# Patient Record
Sex: Female | Born: 1995 | Race: Black or African American | Hispanic: No | Marital: Single | Smoking: Never smoker
Health system: Southern US, Community
[De-identification: ages and names within clinical notes are randomized; demographics above are authoritative.]

## PROBLEM LIST (undated history)

## (undated) DIAGNOSIS — I456 Pre-excitation syndrome: Secondary | ICD-10-CM

## (undated) DIAGNOSIS — D649 Anemia, unspecified: Secondary | ICD-10-CM

## (undated) HISTORY — PX: BREAST REDUCTION SURGERY: SHX8

## (undated) HISTORY — PX: ABLATION OF DYSRHYTHMIC FOCUS: SHX254

## (undated) HISTORY — PX: OTHER SURGICAL HISTORY: SHX169

---

## 2015-06-24 ENCOUNTER — Emergency Department (HOSPITAL_COMMUNITY)
Admission: EM | Admit: 2015-06-24 | Discharge: 2015-06-24 | Disposition: A | Discharge status: Home / Self Care | Payer: BLUE CROSS/BLUE SHIELD | Attending: Emergency Medicine | Admitting: Emergency Medicine

## 2015-06-24 ENCOUNTER — Encounter (HOSPITAL_COMMUNITY): Payer: Self-pay | Admitting: Emergency Medicine

## 2015-06-24 DIAGNOSIS — Z8679 Personal history of other diseases of the circulatory system: Secondary | ICD-10-CM | POA: Insufficient documentation

## 2015-06-24 DIAGNOSIS — J069 Acute upper respiratory infection, unspecified: Secondary | ICD-10-CM | POA: Insufficient documentation

## 2015-06-24 DIAGNOSIS — Z862 Personal history of diseases of the blood and blood-forming organs and certain disorders involving the immune mechanism: Secondary | ICD-10-CM | POA: Diagnosis not present

## 2015-06-24 DIAGNOSIS — R5383 Other fatigue: Secondary | ICD-10-CM | POA: Diagnosis present

## 2015-06-24 HISTORY — DX: Anemia, unspecified: D64.9

## 2015-06-24 HISTORY — DX: Pre-excitation syndrome: I45.6

## 2015-06-24 NOTE — ED Notes (Signed)
AVS explained in detail. Knows to follow up with PCP if needed. Knows to drink lots of fluids/rest/take Mucinex/use nasal spray if needed. No other c/c. No SOB noted.

## 2015-06-24 NOTE — ED Notes (Signed)
Pt states she has not felt good for the past 4 days  Pt states she has had decreased appetite, nausea, diarrhea for the past two days, sore throat, cough, chest soreness, sneezing  Pt states she has just been laying around and feels weak all over

## 2015-06-24 NOTE — ED Provider Notes (Signed)
CSN: [id number]   Arrival date & time 06/24/15 1847  History  This chart was scribed for non-physician practitioner, Earley Favor, NP working with Leta Baptist, MD by Bethel Born, ED Scribe. This patient was seen in room WTR8/WTR8 and the patient's care was started at 8:12 PM.  Chief Complaint  Patient presents with  . Fatigue  . Nausea  . Diarrhea    HPI The history is provided by the patient. No language interpreter was used.   Morgan Kennedy is a 19 y.o. female who presents to the Emergency Department complaining of fatigue with onset 4 days ago. Associated symptoms include sore throat, cough, sneezing, chest tightness, nausea, and diarrhea. She has been using vitamins and rest to treat her symptoms with insufficient relief. Pt denies fever and dysuria.   Past Medical History  Diagnosis Date  . WPW (Wolff-Parkinson-White syndrome)   . Anemia     Past Surgical History  Procedure Laterality Date  . Ablation of dysrhythmic focus    . Extraction of wisdom teeth    . Breast reduction surgery      Family History  Problem Relation Age of Onset  . Diabetes Other     Social History  Substance Use Topics  . Smoking status: Never Smoker   . Smokeless tobacco: None  . Alcohol Use: Yes     Comment: rare     Review of Systems  Constitutional: Positive for fatigue. Negative for fever.  HENT:       Sneezing  Respiratory: Positive for cough and chest tightness.   Gastrointestinal: Positive for nausea and diarrhea.  Genitourinary: Negative for dysuria.    Home Medications   Prior to Admission medications   Not on File    Allergies  Review of patient's allergies indicates no known allergies.  Triage Vitals: BP 126/82 mmHg  Pulse 90  Temp(Src) 98.4 F (36.9 C) (Oral)  Resp 18  SpO2 100%  LMP 06/22/2015 (Exact Date)  Physical Exam  Constitutional: She is oriented to person, place, and time. She appears well-developed and well-nourished.  HENT:  Head:  Normocephalic.  Right Ear: External ear normal.  Left Ear: External ear normal.  Mouth/Throat: Oropharynx is clear and moist.  Eyes: Pupils are equal, round, and reactive to light.  Neck: Normal range of motion.  Cardiovascular: Normal rate and regular rhythm.   Pulmonary/Chest: Effort normal and breath sounds normal.  Musculoskeletal: Normal range of motion.  Lymphadenopathy:    She has no cervical adenopathy.  Neurological: She is alert and oriented to person, place, and time.  Skin: Skin is warm and dry. No rash noted.  Nursing note and vitals reviewed.   ED Course  Procedures  DIAGNOSTIC STUDIES: Oxygen Saturation is 100% on RA,  normal by my interpretation.    COORDINATION OF CARE: 8:14 PM Discussed treatment plan with pt at bedside and pt agreed to plan.  Labs Review- Labs Reviewed - No data to display  Imaging Review No results found.   Appears well and well-hydrated.  I recommend that she take Tylenol or ibuprofen for her discomfort drink plenty of fluids and get plenty of rest MDM   Final diagnoses:  URI (upper respiratory infection)    I personally performed the services described in this documentation, which was scribed in my presence. The recorded information has been reviewed and is accurate.     Earley Favor, NP 06/24/15 5784  Leta Baptist, MD 06/26/15 [phone]

## 2015-06-24 NOTE — Discharge Instructions (Signed)

## 2015-10-01 ENCOUNTER — Emergency Department (HOSPITAL_COMMUNITY)
Admission: EM | Admit: 2015-10-01 | Discharge: 2015-10-01 | Disposition: A | Discharge status: Home / Self Care | Payer: BLUE CROSS/BLUE SHIELD | Attending: Emergency Medicine | Admitting: Emergency Medicine

## 2015-10-01 ENCOUNTER — Emergency Department (HOSPITAL_COMMUNITY): Payer: BLUE CROSS/BLUE SHIELD

## 2015-10-01 ENCOUNTER — Encounter (HOSPITAL_COMMUNITY): Payer: Self-pay | Admitting: Emergency Medicine

## 2015-10-01 DIAGNOSIS — S0990XA Unspecified injury of head, initial encounter: Secondary | ICD-10-CM | POA: Diagnosis not present

## 2015-10-01 DIAGNOSIS — F419 Anxiety disorder, unspecified: Secondary | ICD-10-CM | POA: Insufficient documentation

## 2015-10-01 DIAGNOSIS — S29001A Unspecified injury of muscle and tendon of front wall of thorax, initial encounter: Secondary | ICD-10-CM | POA: Insufficient documentation

## 2015-10-01 DIAGNOSIS — Y92410 Unspecified street and highway as the place of occurrence of the external cause: Secondary | ICD-10-CM | POA: Insufficient documentation

## 2015-10-01 DIAGNOSIS — Y998 Other external cause status: Secondary | ICD-10-CM | POA: Diagnosis not present

## 2015-10-01 DIAGNOSIS — S3992XA Unspecified injury of lower back, initial encounter: Secondary | ICD-10-CM | POA: Insufficient documentation

## 2015-10-01 DIAGNOSIS — Z8679 Personal history of other diseases of the circulatory system: Secondary | ICD-10-CM | POA: Diagnosis not present

## 2015-10-01 DIAGNOSIS — Z7951 Long term (current) use of inhaled steroids: Secondary | ICD-10-CM | POA: Diagnosis not present

## 2015-10-01 DIAGNOSIS — Y9389 Activity, other specified: Secondary | ICD-10-CM | POA: Insufficient documentation

## 2015-10-01 DIAGNOSIS — Z862 Personal history of diseases of the blood and blood-forming organs and certain disorders involving the immune mechanism: Secondary | ICD-10-CM | POA: Insufficient documentation

## 2015-10-01 MED ORDER — IBUPROFEN 800 MG PO TABS
800.0000 mg | ORAL_TABLET | Freq: Once | ORAL | Status: AC
  Administered 2015-10-01: 800 mg via ORAL
  Filled 2015-10-01: qty 1

## 2015-10-01 MED ORDER — IBUPROFEN 800 MG PO TABS: 800.0000 mg | ORAL_TABLET | Freq: Three times a day (TID) | ORAL | Status: DC

## 2015-10-01 MED ORDER — CYCLOBENZAPRINE HCL 10 MG PO TABS: 10.0000 mg | ORAL_TABLET | Freq: Two times a day (BID) | ORAL | Status: DC | PRN

## 2015-10-01 NOTE — Discharge Instructions (Signed)
Take your medications as prescribed. You may also apply ice to affected areas for 15-20 minutes 3-4 times daily as needed for pain relief. Please follow up with a primary care provider from the Resource Guide provided below in 4-5 days. Please return to the Emergency Department if symptoms worsen or new onset of fever, numbness, tingling, groinhesia, loss of bowel or bladder, weakness.    Emergency Department Resource Guide 1) Find a Doctor and Pay Out of Pocket Although you won't have to find out who is covered by your insurance plan, it is a good idea to ask around and get recommendations. You will then need to call the office and see if the doctor you have chosen will accept you as a new patient and what types of options they offer for patients who are self-pay. Some doctors offer discounts or will set up payment plans for their patients who do not have insurance, but you will need to ask so you aren't surprised when you get to your appointment.  2) Contact Your Local Health Department Not all health departments have doctors that can see patients for sick visits, but many do, so it is worth a call to see if yours does. If you don't know where your local health department is, you can check in your phone book. The CDC also has a tool to help you locate your state's health department, and many state websites also have listings of all of their local health departments.  3) Find a Walk-in Clinic If your illness is not likely to be very severe or complicated, you may want to try a walk in clinic. These are popping up all over the country in pharmacies, drugstores, and shopping centers. They're usually staffed by nurse practitioners or physician assistants that have been trained to treat common illnesses and complaints. They're usually fairly quick and inexpensive. However, if you have serious medical issues or chronic medical problems, these are probably not your best option.  No Primary Care  Doctor: - Call Health Connect at  [phone] - they can help you locate a primary care doctor that  accepts your insurance, provides certain services, etc. - Physician Referral Service- [phone]  Chronic Pain Problems: Organization         Address  Phone   Notes  [facility]  [phone] Patients need to be referred by their primary care doctor.   Medication Assistance: Organization         Address  Phone   Notes  [facility] Medication [facility] [address] [place], [place] [address] [phone] --Must be a resident of [facility] -- Must have NO insurance coverage whatsoever (no Medicaid/ Medicare, etc.) -- The pt. MUST have a primary care doctor that directs their care regularly and follows them in the community   MedAssist  [phone]   [employer]  [phone]    Agencies that provide inexpensive medical care: Organization         Address  Phone   Notes  [facility]  [phone]   [facility]    [phone]   [facility] [address] [place], [place] [address] [phone]   [facility] [address] [place] [place] [phone]   Planned Parenthood    [phone]   [facility]    [phone]   Community Health and [facility]  [address]  Mardene Speak Phone:  [phone], Fax:  [phone] Hours of Operation:  9 am - 6 pm, M-F.  Also accepts Medicaid/Medicare and self-pay.  [facility] for [place], [address], [place] Phone: [phone], Fax: [phone]. Hours of Operation:  8:30 am - 5:30 pm, M-F.  Also accepts Medicaid and self-pay.  [facility] [place] [address], [place] Phone: [phone]   [place], [place], [place] [phone], Ext. [phone] Mondays & Thursdays: 7-9 AM.  First 15 patients are seen on a first  come, first serve basis.    [place] Providers:  Organization         Address  Phone   Notes  [facility] [address], [place] [phone] Also accepts self-pay patients.  [facility] [id number] [place], [place]  [phone]   [place], [address], [place] [phone]   [facility] [address] [place] [phone]   Lucianne Lei [address], [place]   [phone] Only accepts [place] Access [place] patients after they have their name applied to their card.   Self-Pay (no insurance) in [facility]:  Organization         Address  Phone   Notes  Sickle Cell Patients, [facility] [place] [phone]   [facility] Urgent Care [place] [phone]   [facility] [place]  [place], [place], [place] [phone]   [facility]/Dr. Osei-Bonsu  [address] [place] or [address], [place] [phone] Phone number for both [place] and [place] locations is the same.  [facility]gent [facility] and [facility] [address] [place] [phone]   [facility] [address], [place] or [address] [phone]   [facility] [address], [place] [phone], phone; [phone], fax Sees patients 1st and 3rd Saturday of every month.  Must not qualify for public or private insurance (i.e. Medicaid, Medicare, [place] Health Choice, Veterans' Benefits)  Household income should be no more than 200% of the poverty level The clinic cannot treat you if you are pregnant or think you are pregnant  Sexually transmitted diseases are not treated at the clinic.    Dental Care: Organization          Address  Phone  Notes  [facility] [place] [phone] Accepts children up to age 30 who are enrolled in [place] or [place]; pregnant women with a Medicaid card; and children who have applied for Medicaid or [place] Health Choice, but were declined, whose parents can pay a reduced fee at time of service.  [facility] Department of [facility]  [address], [place] [phone] Accepts children up to age 23 who are enrolled in [place] or [place]; pregnant women with a Medicaid card; and children who have applied for Medicaid or  Health Choice, but were declined, whose parents can pay a reduced fee at time of service.  [facility]  [place] [phone] Patients are seen  by appointment only. Walk-ins are not accepted. [place] will see patients 67 years of age and older. Monday - Tuesday (8am-5pm) Most Wednesdays (8:30-5pm) $30 per visit, cash only  [facility] Access [facility]  [address], [facility] [phone] Patients are seen by appointment only. Walk-ins are not accepted. [place] will see patients 89 years of age and older. One Wednesday Evening (Monthly: Volunteer Based).  $30 per visit, cash only  [place]  [phone] for adults; Children under age 54, call Graduate Pediatric Dentistry at [phone]. Children aged 71-14, please call [phone] to request a pediatric application.  Dental services are provided in all areas of dental care including fillings, crowns and bridges, complete and partial dentures, implants, gum treatment, root canals, and extractions. Preventive care is also provided. Treatment is provided to both adults and children. Patients are selected via a lottery and there is often a waiting list.   [facility] [address], [place]  [phone] [email or web address]   [facility] [address] [place], [place] [phone], Ext. [phone] Second and Fourth Thursday of each month, opens at 6:30 AM; Clinic ends at 9 AM.  Patients are seen on a first-come first-served basis, and a limited number are seen during each clinic.   [facility]  [address] Hillard D[place]er [place], [place] [phone]   Eligibility Requirements You must have lived in [place], [place], or [place] counties for at least the last three months.   You cannot be eligible for state or federal sponsored [employer], including [employer], [place], or [employer].   You generally cannot be eligible for healthcare insurance through your employer.    How to apply: Eligibility screenings are held every Tuesday and Wednesday afternoon from 1:00 pm until 4:00 pm. You do not need an appointment for the interview!  [facility] [address], [place], [place]   [place]  [place] Department  [place]  [phone]    Behavioral Health Resources in the Community: Intensive Outpatient Programs Organization         Address  Phone  Notes  Rodessa [place]. [address], [place], [place] [phone]   [facility] Outpatient [address] [place], [place]   ADS: Alcohol & Drug Svcs [address], [place], [place]   [place] [address]  [place], [place] or [phone]   Substance Abuse Resources Organization         Address  Phone  Notes  Alcohol and Drug Services  [phone]   [place]  [phone]   [facility] [place]   Chinita Pester  [phone]   Residential & Outpatient Substance Abuse Program  [phone]   Psychological  Services Organization         Address  Phone  Notes  [facility]  [place]  [phone]   [place] [address] [place] or [phone]    Mobile Crisis Teams Organization         Address  Phone  Notes  [facility], Mobile Crisis Care Unit  [phone]   [facility] Psychotherapeutic Services  [address] [place], [place]   [facility] [address], [address] [place]  [phone]    Self-Help/Support Groups Organization         Address  Phone             Notes  Mental Health Assoc. of [place] - variety of support groups  336- [phone] Call for more information  Narcotics Anonymous (NA), [facility] [address], [place]  2 meetings at this location   Statisticianesidential Treatment Programs Organization         Address  Phone  Notes  [facility] [address],    [place]  [phone]   [facility]  [address], [place] [phone], [place], [place] [phone]   [facility] [address], [place] [phone] Admissions: 8am-3pm M-F  [facility] [address]    [place], [place] [phone]   [facility] [address], [place], [place] [phone]   [facility] [address]  [place], [place] [phone]   [facility] - Intensive Outpatient [address], [place], [place] [phone]   [facility] ([facility] [address]  [place], [place] [phone] or [phone]   [facility] Services (RTS) [address] [place], [place] [phone] Accepts Medicaid  [facility] [address]  [place] [phone] Substance Abuse/Addiction Treatment   [facility] Behavioral Health Resources Organization         Address  Phone  Notes  [facility]  [phone](888)  [phone]   Angie FavaJulie Brannon, PhD [address] [place], [place]   [phone] or [phone]) [phone]   [facility]   [address] [place], [place] [phone](336) [phone]   [facility] [address], [place], [place] [phone]) [phone] Insurance/Medicaid/sponsorship through [facility]  [facility] Families [address]                                    [place], [place] [phone]) [phone] Therapy/tele-psych/case  [facility] [address]   [place], [place] [phone]) [phone]    Dr. Lolly MustacheArfeen  [phone]   [facility] [place]  [employer] [facility] 1) [address], [place] 2) [address], [place] 3)  [address], [place] [phone](336) [phone]  [phone](336) [phone]   [facility] Child Abuse Hotline [phone]) [phone] or [phone]) [phone] (After Hours)
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
## 2016-01-28 ENCOUNTER — Encounter (HOSPITAL_COMMUNITY): Payer: Self-pay

## 2016-01-28 ENCOUNTER — Emergency Department (HOSPITAL_COMMUNITY): Payer: BLUE CROSS/BLUE SHIELD

## 2016-01-28 ENCOUNTER — Emergency Department (HOSPITAL_COMMUNITY)
Admission: EM | Admit: 2016-01-28 | Discharge: 2016-01-28 | Disposition: A | Discharge status: Home / Self Care | Payer: BLUE CROSS/BLUE SHIELD | Attending: Emergency Medicine | Admitting: Emergency Medicine

## 2016-01-28 DIAGNOSIS — Y92414 Local residential or business street as the place of occurrence of the external cause: Secondary | ICD-10-CM | POA: Diagnosis not present

## 2016-01-28 DIAGNOSIS — M25512 Pain in left shoulder: Secondary | ICD-10-CM | POA: Insufficient documentation

## 2016-01-28 DIAGNOSIS — Y999 Unspecified external cause status: Secondary | ICD-10-CM | POA: Diagnosis not present

## 2016-01-28 DIAGNOSIS — M542 Cervicalgia: Secondary | ICD-10-CM | POA: Diagnosis not present

## 2016-01-28 DIAGNOSIS — Z6841 Body Mass Index (BMI) 40.0 and over, adult: Secondary | ICD-10-CM | POA: Insufficient documentation

## 2016-01-28 DIAGNOSIS — Z791 Long term (current) use of non-steroidal anti-inflammatories (NSAID): Secondary | ICD-10-CM | POA: Insufficient documentation

## 2016-01-28 DIAGNOSIS — Y939 Activity, unspecified: Secondary | ICD-10-CM | POA: Insufficient documentation

## 2016-01-28 DIAGNOSIS — M25511 Pain in right shoulder: Secondary | ICD-10-CM | POA: Diagnosis not present

## 2016-01-28 MED ORDER — IBUPROFEN 800 MG PO TABS: 800.0000 mg | ORAL_TABLET | Freq: Three times a day (TID) | ORAL | Status: AC

## 2016-01-28 MED ORDER — CYCLOBENZAPRINE HCL 10 MG PO TABS: 10.0000 mg | ORAL_TABLET | Freq: Two times a day (BID) | ORAL | Status: AC | PRN
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
## 2016-01-28 NOTE — Discharge Instructions (Signed)

## 2016-06-16 ENCOUNTER — Emergency Department (HOSPITAL_COMMUNITY): Payer: BLUE CROSS/BLUE SHIELD

## 2016-06-16 ENCOUNTER — Emergency Department (HOSPITAL_COMMUNITY)
Admission: EM | Admit: 2016-06-16 | Discharge: 2016-06-16 | Disposition: A | Discharge status: Home / Self Care | Payer: BLUE CROSS/BLUE SHIELD | Attending: Emergency Medicine | Admitting: Emergency Medicine

## 2016-06-16 ENCOUNTER — Encounter (HOSPITAL_COMMUNITY): Payer: Self-pay | Admitting: *Deleted

## 2016-06-16 DIAGNOSIS — R079 Chest pain, unspecified: Secondary | ICD-10-CM | POA: Diagnosis not present

## 2016-06-16 DIAGNOSIS — Z79899 Other long term (current) drug therapy: Secondary | ICD-10-CM | POA: Insufficient documentation

## 2016-06-16 LAB — BASIC METABOLIC PANEL
ANION GAP: 7 (ref 5–15)
BUN: 11 mg/dL (ref 6–20)
CALCIUM: 9.1 mg/dL (ref 8.9–10.3)
CO2: 25 mmol/L (ref 22–32)
Chloride: 105 mmol/L (ref 101–111)
Creatinine, Ser: 0.77 mg/dL (ref 0.44–1.00)
GFR calc Af Amer: >60 mL/min (ref >60)
GFR calc non Af Amer: >60 mL/min (ref >60)
GLUCOSE: 197 mg/dL — AB (ref 65–99)
Potassium: 3.9 mmol/L (ref 3.5–5.1)
Sodium: 137 mmol/L (ref 135–145)

## 2016-06-16 LAB — I-STAT TROPONIN, ED
TROPONIN I, POC: 0 ng/mL (ref 0.00–0.08)
Troponin i, poc: 0 ng/mL (ref 0.00–0.08)

## 2016-06-16 LAB — CBC
HEMATOCRIT: 28.9 % — AB (ref 36.0–46.0)
HEMOGLOBIN: 9.6 g/dL — AB (ref 12.0–15.0)
MCH: 22.3 pg — AB (ref 26.0–34.0)
MCHC: 33.2 g/dL (ref 30.0–36.0)
MCV: 67.2 fL — ABNORMAL LOW (ref 78.0–100.0)
Platelets: 408 10*3/uL — ABNORMAL HIGH (ref 150–400)
RBC: 4.3 MIL/uL (ref 3.87–5.11)
RDW: 16.7 % — AB (ref 11.5–15.5)
WBC: 9.7 10*3/uL (ref 4.0–10.5)

## 2016-06-16 MED ORDER — NAPROXEN 375 MG PO TABS: 375.0000 mg | ORAL_TABLET | Freq: Two times a day (BID) | ORAL | 0 refills | Status: AC

## 2016-06-16 NOTE — ED Notes (Signed)
PA at the bedside.

## 2016-06-16 NOTE — ED Provider Notes (Signed)
[facility]-EMERGENCY DEPT Provider Note   CSN: [id number] Arrival date & time: 06/16/16  1659  By signing my name below, I, Majel Homer, attest that this documentation has been prepared under the direction and in the presence of non-physician practitioner, Elpidio Anis, PA-C. Electronically Signed: Majel Homer, Scribe. 06/16/2016. 8:40 PM.  History   Chief Complaint Chief Complaint  Patient presents with  . Chest Pain   The history is provided by the patient. No language interpreter was used.   HPI Comments: Morgan Kennedy is a 20 y.o. female with PMHx of anemia, DM (diagnosed 2 months ago) and WPW, who presents to the Emergency Department complaining of gradually improving, intermittent, left sided chest pain that began this afternoon. Pt reports she was sitting at her desk at work when she suddenly began to feel dizzy and nauseous with associated chest pain. She describes her chest pain as "muscle pain" and notes it is exacerbated with movement; she states it sometimes radiates into her left shoulder and back. She notes associated difficulty breathing during episodes of chest pain. Pt reports hx of WPW and states this felt similar; however, pt states she had an ablation in 2013 and was told she no longer had it. Pt reports she used to take iron for her anemia but states she was recently taken off of it by her PCP due to how it may affect her DM medication. She denies vomiting.    Past Medical History:  Diagnosis Date  . Anemia   . WPW (Wolff-Parkinson-White syndrome)    There are no active problems to display for this patient.  Past Surgical History:  Procedure Laterality Date  . ABLATION OF DYSRHYTHMIC FOCUS    . BREAST REDUCTION SURGERY    . extraction of wisdom teeth     OB History    No data available     Home Medications    Prior to Admission medications   Medication Sig Start Date End Date Taking? Authorizing Provider  atorvastatin (LIPITOR) 20 MG tablet Take 20 mg by mouth  daily.   Yes Historical Provider, MD  Lactobacillus (ACIDOPHILUS PO) Take 1 tablet by mouth daily.   Yes Historical Provider, MD  Multiple Vitamin (MULTIVITAMIN WITH MINERALS) TABS tablet Take 1 tablet by mouth daily.   Yes Historical Provider, MD  perindopril (ACEON) 4 MG tablet Take 4 mg by mouth daily.   Yes Historical Provider, MD  sitaGLIPtin-metformin (JANUMET) 50-500 MG tablet Take 1 tablet by mouth 2 (two) times daily with a meal.   Yes Historical Provider, MD  Coenzyme Q-10 200 MG CAPS Take 300 mg by mouth daily. 05/11/16 05/11/17  Historical Provider, MD  cyclobenzaprine (FLEXERIL) 10 MG tablet Take 1 tablet (10 mg total) by mouth 2 (two) times daily as needed for muscle spasms. Patient not taking: Reported on 06/16/2016 01/28/16   Fayrene Helper, PA-C  dapagliflozin propanediol (FARXIGA) 5 MG TABS tablet Take 5 mg by mouth daily. 05/31/16 06/30/16  Historical Provider, MD  ibuprofen (ADVIL,MOTRIN) 800 MG tablet Take 1 tablet (800 mg total) by mouth 3 (three) times daily. Patient not taking: Reported on 06/16/2016 01/28/16   Fayrene Helper, PA-C    Family History Family History  Problem Relation Age of Onset  . Diabetes Other     Social History Social History  Substance Use Topics  . Smoking status: Never Smoker  . Smokeless tobacco: Never Used  . Alcohol use Yes     Comment: rare     Allergies   Hydrocodone  Review of Systems Review of Systems  Respiratory: Positive for shortness of breath.   Cardiovascular: Positive for chest pain.  Gastrointestinal: Positive for nausea. Negative for vomiting.  Neurological: Positive for dizziness.   Physical Exam Updated Vital Signs BP 100/68 (BP Location: Left Arm)   Pulse 68   Temp 98.4 F (36.9 C) (Oral)   Resp 19   LMP 06/13/2016   SpO2 100%   Physical Exam  Constitutional: She is oriented to person, place, and time. She appears well-developed and well-nourished. No distress.  Appears comfortable  HENT:  Head: Normocephalic and  atraumatic.  Eyes: Conjunctivae are normal. Pupils are equal, round, and reactive to light. Right eye exhibits no discharge. Left eye exhibits no discharge. No scleral icterus.  Neck: Normal range of motion. No JVD present. No tracheal deviation present.  Cardiovascular: Regular rhythm.   Pulmonary/Chest: Breath sounds normal. No stridor.  Moderate left upper chest wall tenderness  Lungs clear bilaterally  Abdominal: There is no tenderness.  Neurological: She is alert and oriented to person, place, and time. Coordination normal.  Psychiatric: She has a normal mood and affect. Her behavior is normal. Judgment and thought content normal.  Nursing note and vitals reviewed.  ED Treatments / Results  Labs (all labs ordered are listed, but only abnormal results are displayed) Labs Reviewed  BASIC METABOLIC PANEL - Abnormal; Notable for the following:       Result Value   Glucose, Bld 197 (*)    All other components within normal limits  CBC - Abnormal; Notable for the following:    Hemoglobin 9.6 (*)    HCT 28.9 (*)    MCV 67.2 (*)    MCH 22.3 (*)    RDW 16.7 (*)    Platelets 408 (*)    All other components within normal limits  I-STAT TROPOININ, ED    EKG  EKG Interpretation None       Radiology Dg Chest 2 View  Result Date: 06/16/2016 CLINICAL DATA:  Left side chest pain EXAM: CHEST  2 VIEW COMPARISON:  10/01/2015 FINDINGS: The heart size and mediastinal contours are within normal limits. Both lungs are clear. The visualized skeletal structures are unremarkable. IMPRESSION: No active cardiopulmonary disease. Electronically Signed   By: Natasha Mead M.D.   On: 06/16/2016 18:19   Procedures Procedures  DIAGNOSTIC STUDIES:  Oxygen Saturation is 100% on RA, normal by my interpretation.    COORDINATION OF CARE:  8:35 PM Discussed treatment plan with pt at bedside and pt agreed to plan.  Medications Ordered in ED Medications - No data to display   Initial Impression /  Assessment and Plan / ED Course  I have reviewed the triage vital signs and the nursing notes.  Pertinent labs & imaging results that were available during my care of the patient were reviewed by me and considered in my medical decision making (see chart for details).  Clinical Course    1. Chest pain, nonspecific 2. 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 No fever. Pain has persisted throughout the day.  Patient is a nonsmoker, on no estrogens, not tachycardic. She PERC negative for PE. H/o WPW s/p ablation 2013, nonspecific EKG changes.. Neg troponin and delta trop. Doubt ACS. Chest is tender, consider chest wall pain. Will treat symptomatically and encourage PCP follow up for recheck this week.   I personally performed the services described in this  documentation, which was scribed in my presence. The recorded information has been reviewed and is accurate.   Final Clinical Impressions(s) / ED Diagnoses   Final diagnoses:  None    New Prescriptions New Prescriptions   No medications on file     Elpidio AnisShari Tenessa Marsee, Cordelia Poche-C 06/16/16 2156    Laurence Spatesachel Morgan Little, MD 06/17/16 [phone]

## 2016-06-16 NOTE — ED Notes (Signed)
Family at bedside. 

## 2016-06-16 NOTE — ED Triage Notes (Signed)
Pt reports cp with lightheadedness and cp today which started at 1300 with nausea.  Pt reports L cp radiating to her L shoulder and back.  Pt has hx of wolf-

## 2017-06-21 IMAGING — CR DG CHEST 2V
2 series · 2 of 2 positions shown · non-contrast
Comparison: 10/01/2015

CLINICAL DATA: Left side chest pain

EXAM:
CHEST  2 VIEW

[w chest pa]
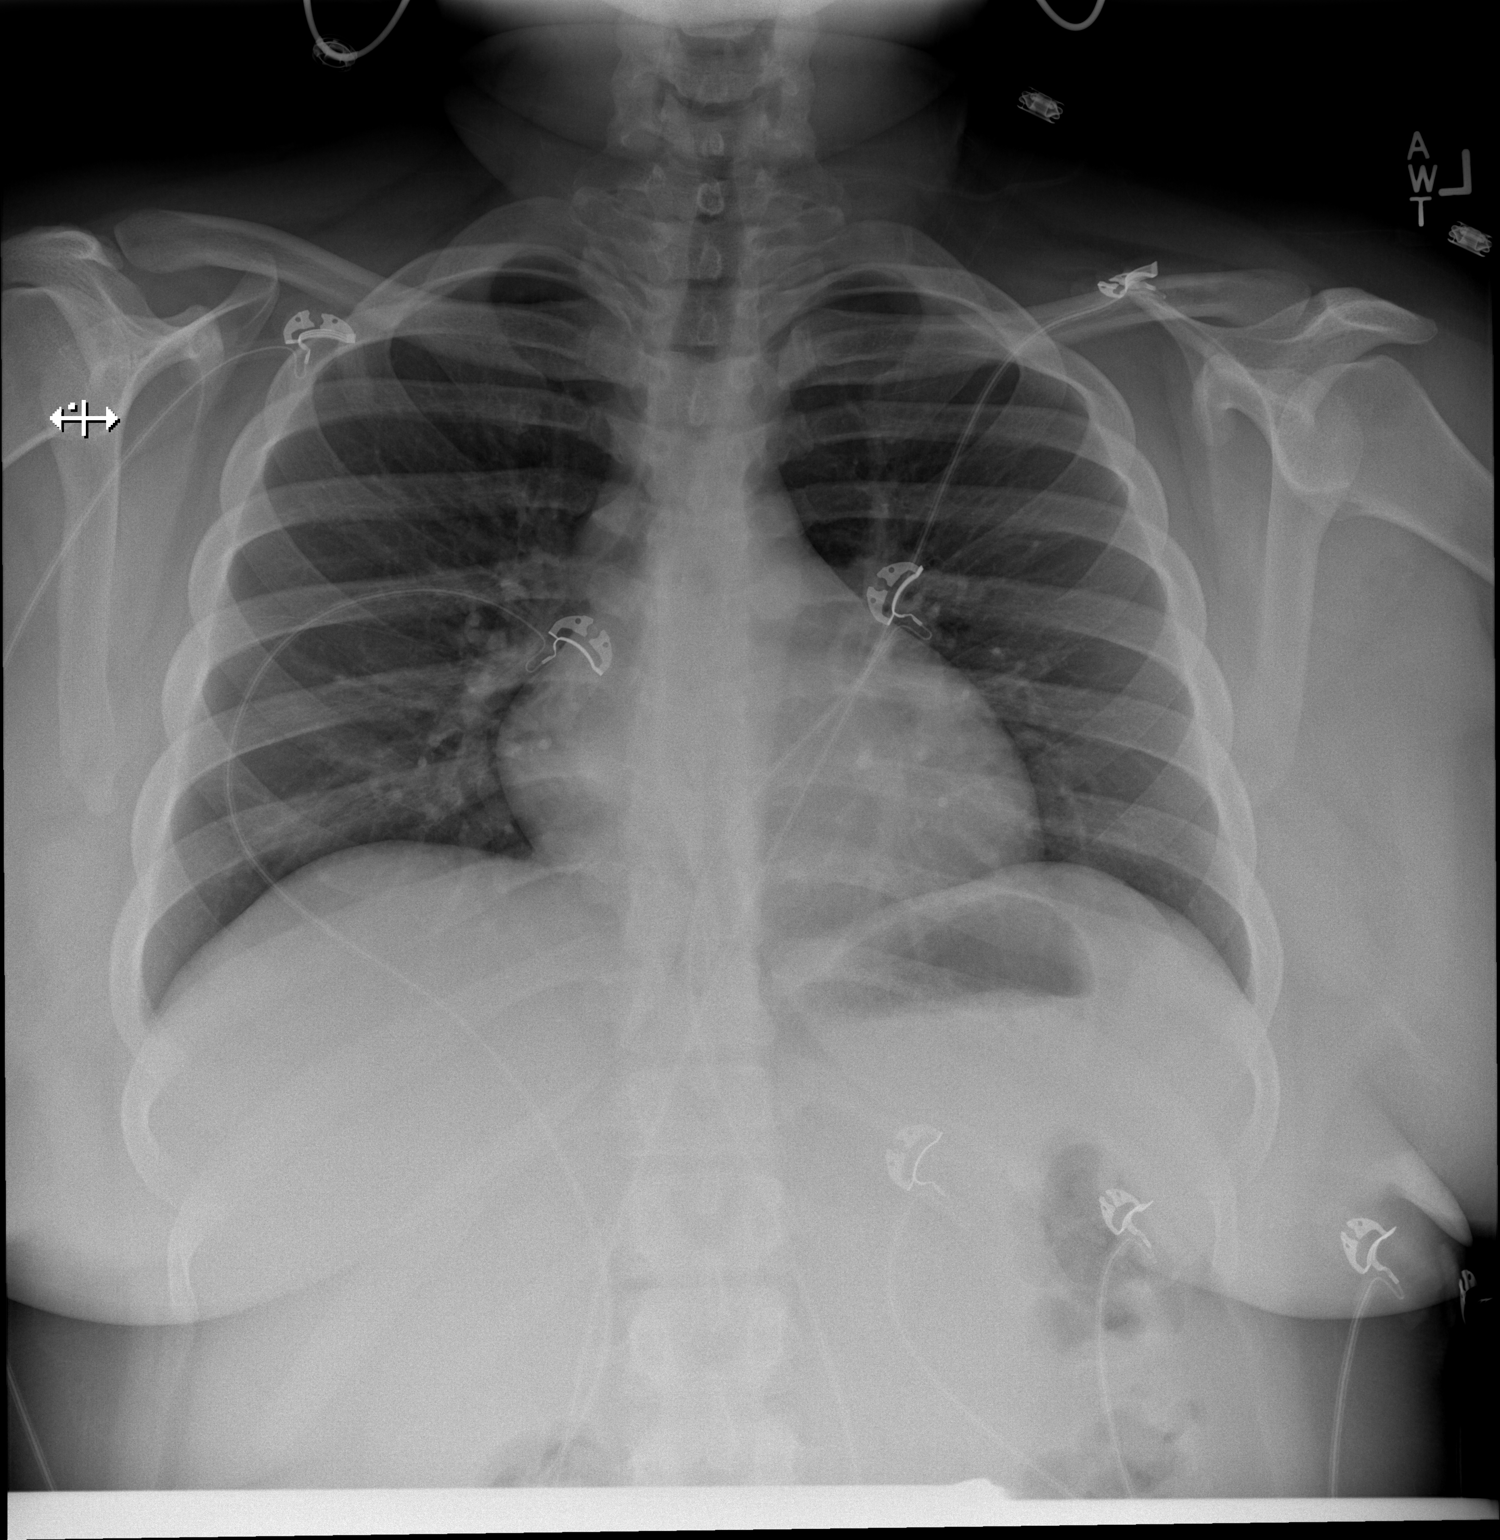

[w chest lat]
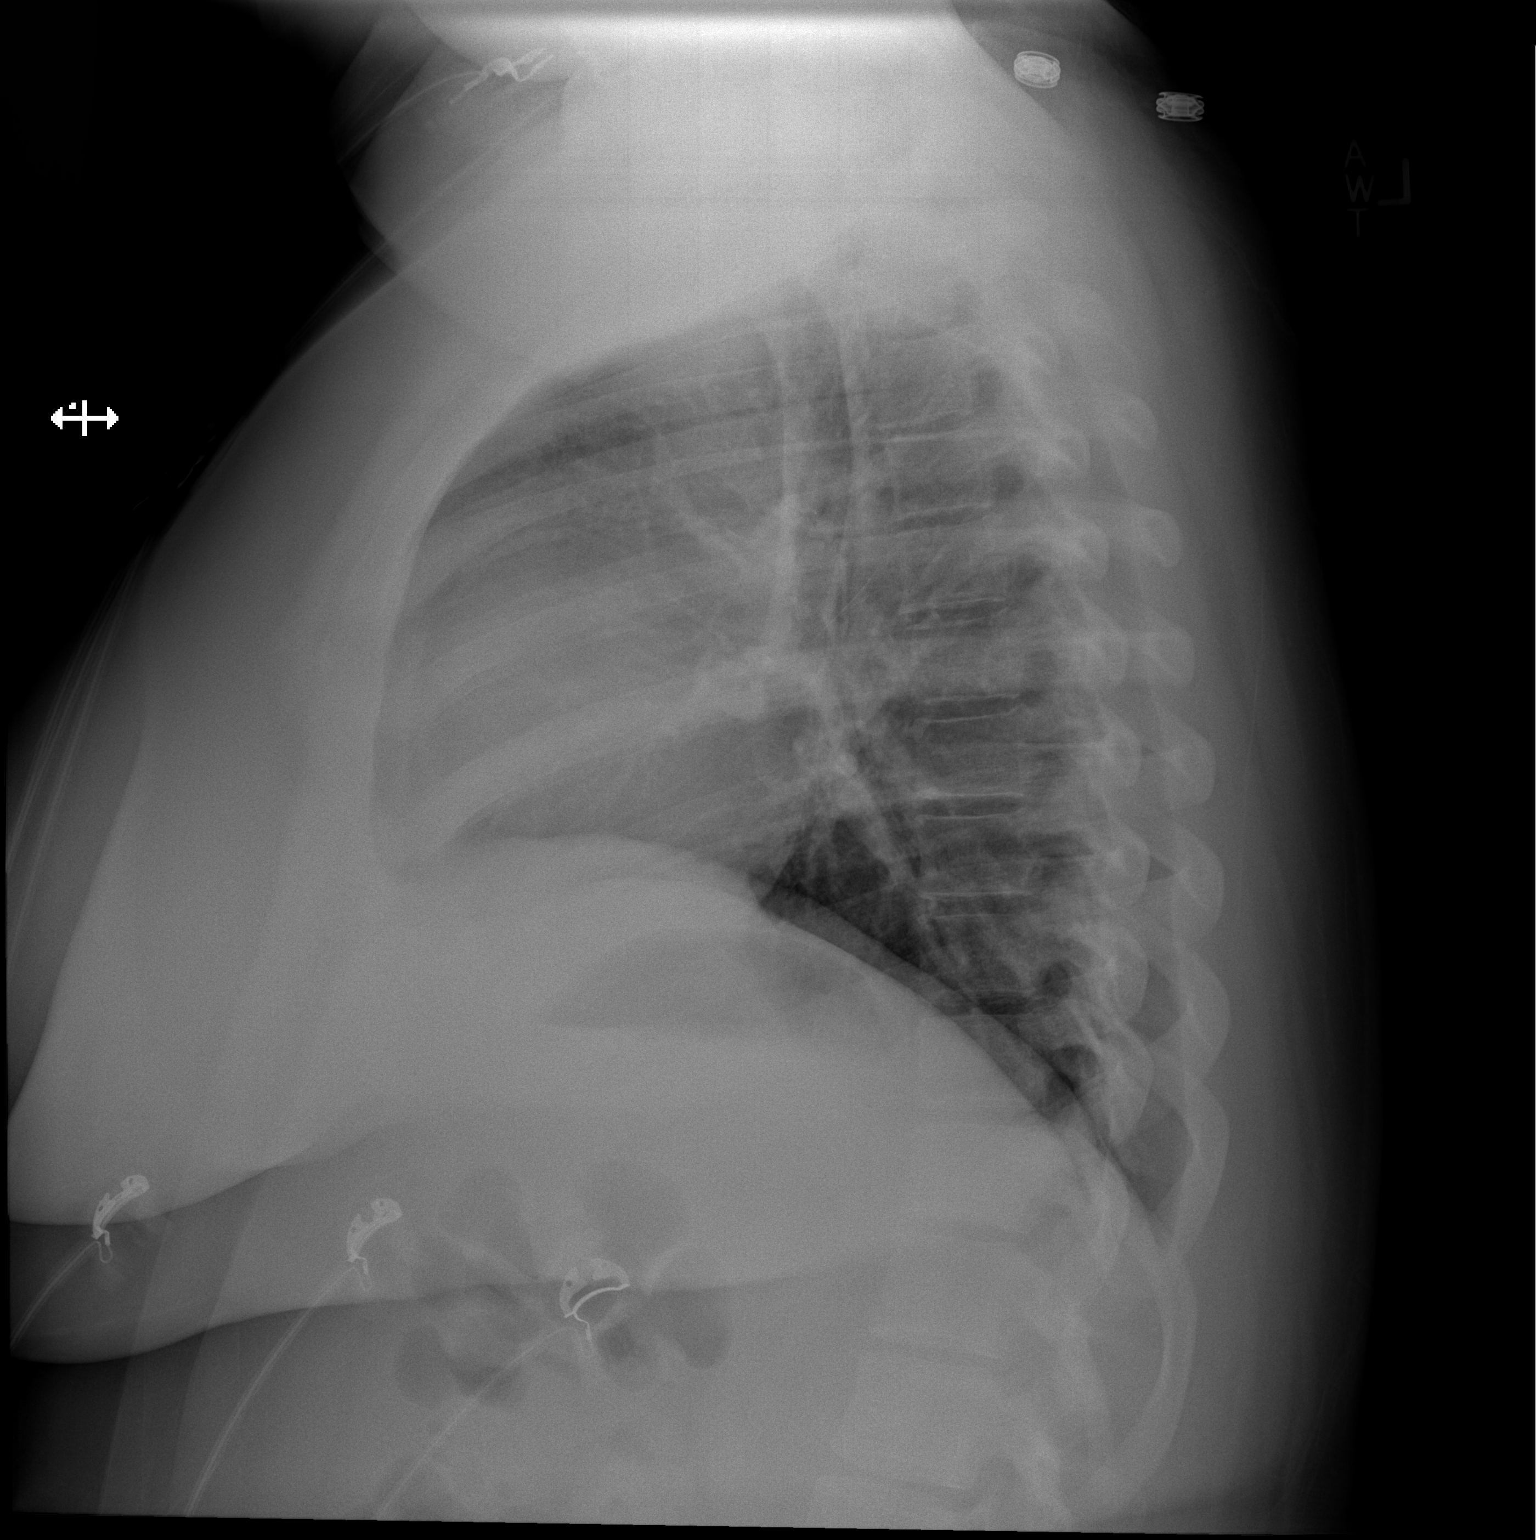

[2 of 2 positions shown; findings below may reference images not displayed]

FINDINGS: The heart size and mediastinal contours are within normal limits.
Both lungs are clear. The visualized skeletal structures are
unremarkable.
IMPRESSION: No active cardiopulmonary disease.
